# Patient Record
Sex: Female | Born: 1980 | Race: White | Hispanic: No | Marital: Married | Smoking: Never smoker
Health system: Southern US, Community
[De-identification: ages and names within clinical notes are randomized; demographics above are authoritative.]

## PROBLEM LIST (undated history)

## (undated) HISTORY — PX: WISDOM TOOTH EXTRACTION: SHX21

---

## 2001-09-15 HISTORY — PX: BREAST REDUCTION SURGERY: SHX8

## 2002-01-24 ENCOUNTER — Other Ambulatory Visit: Admission: RE | Admit: 2002-01-24 | Discharge: 2002-01-24 | Payer: Self-pay | Admitting: *Deleted

## 2003-01-31 ENCOUNTER — Other Ambulatory Visit: Admission: RE | Admit: 2003-01-31 | Discharge: 2003-01-31 | Payer: Self-pay | Admitting: *Deleted

## 2004-07-17 ENCOUNTER — Other Ambulatory Visit: Admission: RE | Admit: 2004-07-17 | Discharge: 2004-07-17 | Payer: Self-pay | Admitting: Obstetrics and Gynecology

## 2005-11-10 ENCOUNTER — Other Ambulatory Visit: Admission: RE | Admit: 2005-11-10 | Discharge: 2005-11-10 | Payer: Self-pay | Admitting: Obstetrics and Gynecology

## 2005-11-13 ENCOUNTER — Encounter: Payer: Self-pay | Admitting: Family Medicine

## 2005-11-21 ENCOUNTER — Encounter: Admission: RE | Admit: 2005-11-21 | Discharge: 2005-11-21 | Payer: Self-pay | Admitting: Obstetrics and Gynecology

## 2006-03-05 ENCOUNTER — Ambulatory Visit: Payer: Self-pay | Admitting: Family Medicine

## 2006-06-23 DIAGNOSIS — J309 Allergic rhinitis, unspecified: Secondary | ICD-10-CM | POA: Insufficient documentation

## 2007-01-12 ENCOUNTER — Encounter: Payer: Self-pay | Admitting: Family Medicine

## 2007-09-16 HISTORY — PX: OTHER SURGICAL HISTORY: SHX169

## 2008-08-01 ENCOUNTER — Ambulatory Visit: Payer: Self-pay | Admitting: Obstetrics & Gynecology

## 2008-08-01 ENCOUNTER — Encounter: Payer: Self-pay | Admitting: Obstetrics and Gynecology

## 2008-08-01 LAB — CONVERTED CEMR LAB
Antibody Screen: NEGATIVE
Basophils Relative: 0 % (ref 0–1)
Eosinophils Absolute: 0.1 10*3/uL (ref 0.0–0.7)
Eosinophils Relative: 1 % (ref 0–5)
Lymphs Abs: 2.1 10*3/uL (ref 0.7–4.0)
Neutrophils Relative %: 72 % (ref 43–77)
Platelets: 257 10*3/uL (ref 150–400)
RDW: 12.3 % (ref 11.5–15.5)
Rh Type: POSITIVE
Rubella: 56.9 intl units/mL — ABNORMAL HIGH
WBC: 10.1 10*3/uL (ref 4.0–10.5)

## 2008-08-02 ENCOUNTER — Encounter: Payer: Self-pay | Admitting: Obstetrics and Gynecology

## 2008-08-28 ENCOUNTER — Encounter: Payer: Self-pay | Admitting: Family

## 2008-08-28 ENCOUNTER — Ambulatory Visit: Payer: Self-pay | Admitting: Family

## 2008-09-25 ENCOUNTER — Ambulatory Visit: Payer: Self-pay | Admitting: Obstetrics and Gynecology

## 2008-10-20 ENCOUNTER — Ambulatory Visit (HOSPITAL_COMMUNITY): Admission: RE | Admit: 2008-10-20 | Discharge: 2008-10-20 | Payer: Self-pay | Admitting: Obstetrics & Gynecology

## 2008-10-23 ENCOUNTER — Ambulatory Visit: Payer: Self-pay | Admitting: Obstetrics and Gynecology

## 2008-11-20 ENCOUNTER — Ambulatory Visit: Payer: Self-pay | Admitting: Family

## 2008-12-25 ENCOUNTER — Ambulatory Visit: Payer: Self-pay | Admitting: Family

## 2008-12-26 ENCOUNTER — Encounter: Payer: Self-pay | Admitting: Obstetrics and Gynecology

## 2009-01-08 ENCOUNTER — Ambulatory Visit: Payer: Self-pay | Admitting: Obstetrics and Gynecology

## 2009-01-22 ENCOUNTER — Ambulatory Visit: Payer: Self-pay | Admitting: Family

## 2009-02-02 ENCOUNTER — Ambulatory Visit: Payer: Self-pay | Admitting: Family

## 2009-02-16 ENCOUNTER — Ambulatory Visit: Payer: Self-pay | Admitting: Family

## 2009-02-17 ENCOUNTER — Encounter: Payer: Self-pay | Admitting: Family

## 2009-02-17 LAB — CONVERTED CEMR LAB: GC Probe Amp, Genital: NEGATIVE

## 2009-02-23 ENCOUNTER — Ambulatory Visit: Payer: Self-pay | Admitting: Physician Assistant

## 2009-03-02 ENCOUNTER — Ambulatory Visit: Payer: Self-pay | Admitting: Family

## 2009-03-09 ENCOUNTER — Ambulatory Visit: Payer: Self-pay | Admitting: Physician Assistant

## 2009-03-15 ENCOUNTER — Inpatient Hospital Stay (HOSPITAL_COMMUNITY): Admission: AD | Admit: 2009-03-15 | Discharge: 2009-03-15 | Payer: Self-pay | Admitting: Obstetrics & Gynecology

## 2009-03-15 ENCOUNTER — Ambulatory Visit: Payer: Self-pay | Admitting: Obstetrics and Gynecology

## 2009-03-16 ENCOUNTER — Ambulatory Visit: Payer: Self-pay | Admitting: Obstetrics and Gynecology

## 2009-03-16 ENCOUNTER — Inpatient Hospital Stay (HOSPITAL_COMMUNITY): Admission: AD | Admit: 2009-03-16 | Discharge: 2009-03-18 | Payer: Self-pay | Admitting: Obstetrics & Gynecology

## 2009-03-23 ENCOUNTER — Ambulatory Visit: Admission: RE | Admit: 2009-03-23 | Discharge: 2009-03-23 | Payer: Self-pay | Admitting: Obstetrics & Gynecology

## 2009-04-27 ENCOUNTER — Ambulatory Visit: Payer: Self-pay | Admitting: Physician Assistant

## 2009-11-19 ENCOUNTER — Ambulatory Visit: Payer: Self-pay | Admitting: Family

## 2009-11-20 ENCOUNTER — Encounter: Payer: Self-pay | Admitting: Family

## 2009-11-20 LAB — CONVERTED CEMR LAB
Antibody Screen: NEGATIVE
Basophils Absolute: 0 10*3/uL (ref 0.0–0.1)
Basophils Relative: 0 % (ref 0–1)
Eosinophils Absolute: 0.1 10*3/uL (ref 0.0–0.7)
Eosinophils Relative: 1 % (ref 0–5)
HCT: 38 % (ref 36.0–46.0)
Hemoglobin: 12.5 g/dL (ref 12.0–15.0)
RBC: 3.97 M/uL (ref 3.87–5.11)
RDW: 13 % (ref 11.5–15.5)

## 2009-12-17 ENCOUNTER — Ambulatory Visit: Payer: Self-pay | Admitting: Obstetrics and Gynecology

## 2010-01-14 ENCOUNTER — Ambulatory Visit: Payer: Self-pay | Admitting: Advanced Practice Midwife

## 2010-02-19 ENCOUNTER — Ambulatory Visit: Payer: Self-pay | Admitting: Obstetrics & Gynecology

## 2010-02-19 ENCOUNTER — Ambulatory Visit (HOSPITAL_COMMUNITY): Admission: RE | Admit: 2010-02-19 | Discharge: 2010-02-19 | Payer: Self-pay | Admitting: Obstetrics & Gynecology

## 2010-03-19 ENCOUNTER — Ambulatory Visit: Payer: Self-pay | Admitting: Obstetrics & Gynecology

## 2010-03-20 ENCOUNTER — Encounter: Payer: Self-pay | Admitting: Physician Assistant

## 2010-03-20 LAB — CONVERTED CEMR LAB

## 2010-04-15 ENCOUNTER — Ambulatory Visit: Payer: Self-pay | Admitting: Advanced Practice Midwife

## 2010-04-29 ENCOUNTER — Ambulatory Visit: Payer: Self-pay | Admitting: Physician Assistant

## 2010-05-13 ENCOUNTER — Ambulatory Visit: Payer: Self-pay | Admitting: Obstetrics and Gynecology

## 2010-05-27 ENCOUNTER — Ambulatory Visit: Payer: Self-pay | Admitting: Advanced Practice Midwife

## 2010-06-03 ENCOUNTER — Ambulatory Visit: Payer: Self-pay | Admitting: Advanced Practice Midwife

## 2010-06-10 ENCOUNTER — Ambulatory Visit: Payer: Self-pay | Admitting: Obstetrics & Gynecology

## 2010-06-17 ENCOUNTER — Ambulatory Visit: Payer: Self-pay | Admitting: Physician Assistant

## 2010-06-24 ENCOUNTER — Ambulatory Visit: Payer: Self-pay | Admitting: Obstetrics & Gynecology

## 2010-07-01 ENCOUNTER — Ambulatory Visit: Payer: Self-pay | Admitting: Obstetrics and Gynecology

## 2010-07-05 ENCOUNTER — Ambulatory Visit: Payer: Self-pay | Admitting: Advanced Practice Midwife

## 2010-07-08 ENCOUNTER — Ambulatory Visit: Payer: Self-pay | Admitting: Family Medicine

## 2010-07-08 ENCOUNTER — Inpatient Hospital Stay (HOSPITAL_COMMUNITY): Admission: AD | Admit: 2010-07-08 | Discharge: 2010-07-09 | Payer: Self-pay | Admitting: Obstetrics and Gynecology

## 2010-08-19 ENCOUNTER — Ambulatory Visit: Payer: Self-pay | Admitting: Advanced Practice Midwife

## 2010-11-27 LAB — CBC
HCT: 33.3 % — ABNORMAL LOW (ref 36.0–46.0)
HCT: 35.7 % — ABNORMAL LOW (ref 36.0–46.0)
Hemoglobin: 11.3 g/dL — ABNORMAL LOW (ref 12.0–15.0)
MCH: 33.4 pg (ref 26.0–34.0)
MCHC: 33.5 g/dL (ref 30.0–36.0)
MCV: 98.7 fL (ref 78.0–100.0)
Platelets: 247 10*3/uL (ref 150–400)
RBC: 3.37 MIL/uL — ABNORMAL LOW (ref 3.87–5.11)
RBC: 3.63 MIL/uL — ABNORMAL LOW (ref 3.87–5.11)
RDW: 13.5 % (ref 11.5–15.5)
RDW: 13.9 % (ref 11.5–15.5)
WBC: 19.7 10*3/uL — ABNORMAL HIGH (ref 4.0–10.5)

## 2010-12-22 LAB — CBC: MCHC: 34.3 g/dL (ref 30.0–36.0)

## 2011-01-28 NOTE — Assessment & Plan Note (Signed)
NAME:  LINZIE, CRISS NO.:  [id number]   MEDICAL RECORD NO.:  [id number]          PATIENT TYPE:  POB   LOCATION:  [facility] at [place]         FACILITY:  [facility]   PHYSICIAN:  Maylon Cos, CNM    DATE OF BIRTH:  March 31, 1981   DATE OF SERVICE:  04/27/2009                                  CLINIC NOTE   The patient is being seen in the [facility] [employer] at  [place].  Today's visit is for her 6-week postpartum visit.   HISTORY OF PRESENT ILLNESS:  The patient had a spontaneous vaginal  delivery at [facility] in [place] on March 16, 2009.  She  delivered a viable female infant, weighing 7 pounds 12 ounces, slower by  Caren Griffins, under epidural anesthesia.  She had no complications.  She  did have a small periurethral laceration that was repaired, she had no  vaginal lacerations.  She presents today with no complaints.  She is  breast-feeding and supplementing using DNS supplementation system is  being followed by Lactation at [facility].  She is currently  continuing her prenatal vitamins and is using condoms for birth control.  She has resumed intercourse and has no complaints.  She states that she  is at postpartum bleeding for approximately 3-1/2 weeks postpartum and  has not resumed period yet.  She is pumping and nursing approximately 4-  5 times a day.  She reports sleeping 4-6 hours nightly.  She feels like  she is getting adequate nutrition and this has been no complaints or  denies symptoms of postpartum blues and/or depression.  She is getting  adequate support from her family and husband and plans to return to work  in 1 month.  Currently, she is on 17 pounds of the postpartum period and  is about 15 pounds away from her prepregnancy weight.  She is planning  to begin an exercise regimen as she just brought a new treadmill to  begin exercise and desires clearance to begin this regimen.   PHYSICAL EXAMINATION:  GENERAL:  Today, a  pleasant 30 year old Caucasian  female who is in no apparent distress.  VITAL SIGNS:  Stable.  Her pulse is 72, blood pressure is 122/77, weight  is 178, and height is 66 inches.  HEENT:  Exam is grossly normal.  BREASTS:  Soft and nontender and lactating.  ABDOMEN:  Benign.  No diastasis.  RECTAL:  Deferred secondary to no complaints.  EXTREMITIES:  Warm to touch with even hair distribution and no edema.   ASSESSMENT:  1. Six weeks postpartum, status post normal spontaneous vaginal      delivery of a female infant.  2. Breast-feeding and supplementing with DNS, being followed by      Lactation.  3. Contraceptive non-hormonal condoms and spermicide recommended.   PLAN:  1. The patient is to continue condoms and spermicide use for birth      control.  2. Continue daily prenatal vitamin for health maintenance and also      folic acid was given.  She is on non-hormonal birth control.  3. Clearance given to initiate diet and exercise as discussed.  4. Will return to  work as planned in 1 month.  5. Followup in December for annual exam or p.r.n. problems.           ______________________________  Maylon Cos, CNM     SS/MEDQ  D:  04/27/2009  T:  04/27/2009  Job:  [id number]

## 2011-01-28 NOTE — Assessment & Plan Note (Signed)
NAME:  Stacy Chavez, Stacy Chavez NO.:  [id number]   MEDICAL RECORD NO.:  [id number]          PATIENT TYPE:  POB   LOCATION:  [facility] at [place]         FACILITY:  [facility]   PHYSICIAN:  Wynelle Bourgeois, CNM    DATE OF BIRTH:  06-23-1981   DATE OF SERVICE:  08/19/2010                                  CLINIC NOTE   This is a 30 year old, gravida 2, para 2 who is approximately 6 weeks'  postpartum who presents for her postpartum checkup.  She is requesting  diaphragm fitting for contraception.  She states she has reviewed some  information on her own and has no questions about the diaphragm.  She  had a vaginal delivery on July 08, 2010, with Dr. Shawnie Pons of a viable  female infant weighing 7 pounds 14 ounces, Apgars 9 and 9.  She had just  a first-degree abrasion which was not repaired and otherwise did very  well.  Her delivery was remarkable for a rapid second stage.  She has  had normal progression of her lochia diminishment since delivery.  She  is not complaining of any pain.  She is breast-feeding without  difficulty and has no complaints.   OBJECTIVE DATA:  VITAL SIGNS:  Pulse 79, blood pressure 138/86, weight  173, height 66 inches.   ALLERGIES:  None.   Last Pap smear was in March.  She is currently taking prenatal vitamins.  Abdomen is soft and nontender.  Perineum is well-healed. Vagina is  clean, well rugated with no lacerations or erythema.  Cervix is closed.  Uterus is small and well involuted.  Adnexa are nontender.  Her  diaphragm fitting was performed and the size 8 seemed to fit the best.  She does have a shallow pubic arch and there is slight movement of the  diaphragm under the pubic arch, no matter what size is utilized.  The  patient is made aware of this. Prescription was given for All-Flex #8  diaphragm with one refill.  The patient was instructed to always use  spermicide with the diaphragm and is aware of failure rate and agrees to  proceed.  She  will continue prenatal vitamins while breast feeding.           ______________________________  Wynelle Bourgeois, CNM     MW/MEDQ  D:  08/19/2010  T:  08/20/2010  Job:  [id number]

## 2011-09-02 ENCOUNTER — Ambulatory Visit (INDEPENDENT_AMBULATORY_CARE_PROVIDER_SITE_OTHER): Payer: BC Managed Care – PPO | Admitting: Physician Assistant

## 2011-09-02 ENCOUNTER — Encounter: Payer: Self-pay | Admitting: Physician Assistant

## 2011-09-02 VITALS — BP 121/77 | HR 71 | Temp 98.5°F | Resp 16 | Ht 66.0 in | Wt 146.0 lb

## 2011-09-02 DIAGNOSIS — Z01419 Encounter for gynecological examination (general) (routine) without abnormal findings: Secondary | ICD-10-CM

## 2011-09-02 DIAGNOSIS — Z0000 Encounter for general adult medical examination without abnormal findings: Secondary | ICD-10-CM

## 2011-09-02 NOTE — Progress Notes (Signed)
30 yo G2P2002 with no complaints, NL periods. LMP 08/17/2011. No exam performed today secondary to no hx of abnormal pap, last pap due 11/23/09. Full physical and bloodwork perform in last 6 months with PCP. Natural family planning. Husband scheduled for vasectomy in February.   RTC 11/2012 for annual with pap or prn problems.  Cortlandt Capuano E. 09/02/2011 3:55 PM

## 2011-09-02 NOTE — Patient Instructions (Signed)
Exercise to Stay Healthy Exercise helps you become and stay healthy. EXERCISE IDEAS AND TIPS Choose exercises that:  You enjoy.   Fit into your day.  You do not need to exercise really hard to be healthy. You can do exercises at a slow or medium level and stay healthy. You can:  Stretch before and after working out.   Try yoga, Pilates, or tai chi.   Lift weights.   Walk fast, swim, jog, run, climb stairs, bicycle, dance, or rollerskate.   Take aerobic classes.  Exercises that burn about 150 calories:  Running 1  miles in 15 minutes.   Playing volleyball for 45 to 60 minutes.   Washing and waxing a car for 45 to 60 minutes.   Playing touch football for 45 minutes.   Walking 1  miles in 35 minutes.   Pushing a stroller 1  miles in 30 minutes.   Playing basketball for 30 minutes.   Raking leaves for 30 minutes.   Bicycling 5 miles in 30 minutes.   Walking 2 miles in 30 minutes.   Dancing for 30 minutes.   Shoveling snow for 15 minutes.   Swimming laps for 20 minutes.   Walking up stairs for 15 minutes.   Bicycling 4 miles in 15 minutes.   Gardening for 30 to 45 minutes.   Jumping rope for 15 minutes.   Washing windows or floors for 45 to 60 minutes.  Document Released: 10/04/2010 Document Revised: 05/14/2011 Document Reviewed: 10/04/2010 Exit[employer] Patient Information 2012 [employer]. 

## 2012-05-05 IMAGING — US US OB DETAIL+14 WK
1 series · 14 of 28 positions shown · non-contrast
Comparison: none

OBSTETRICAL ULTRASOUND:
 This ultrasound exam was performed in the [HOSPITAL] Ultrasound Department.  The OB US report was generated in the AS system, and faxed to the ordering physician.  This report is also available in [HOSPITAL]?s AccessANYware and in [REDACTED] PACS.

[Series 1: us ob detail +14 wk · 0.24mm/px · 14 of 59 slices shown]
[im 3/59]
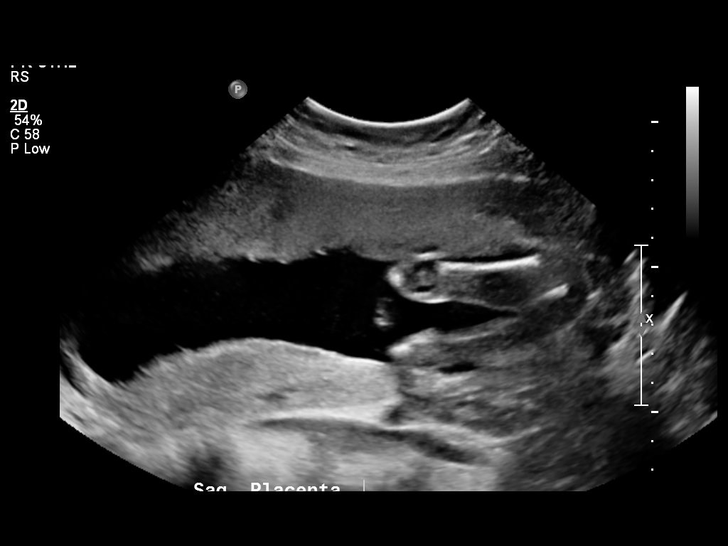
[im 7/59]
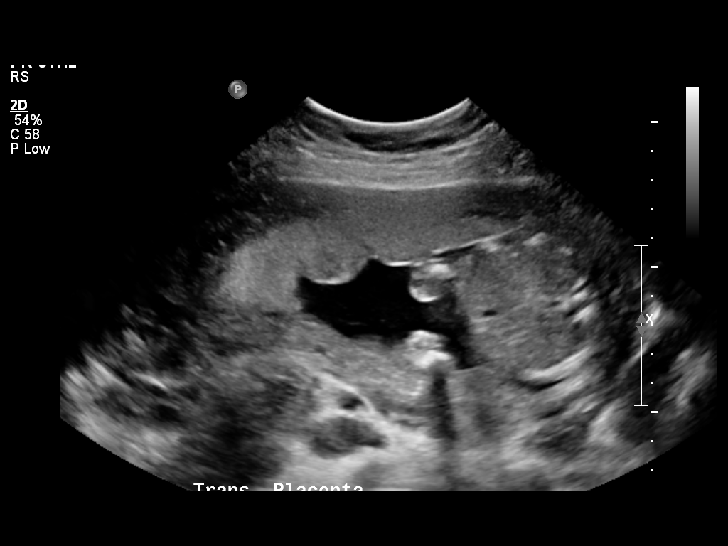
[im 11/59]
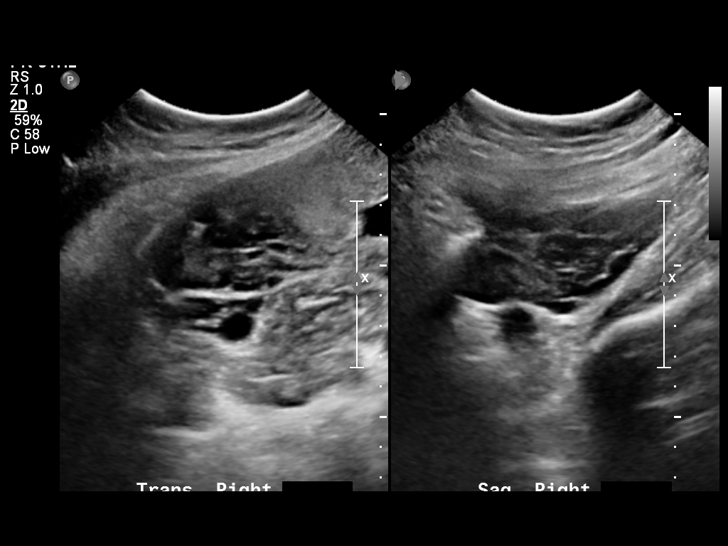
[im 16/59]
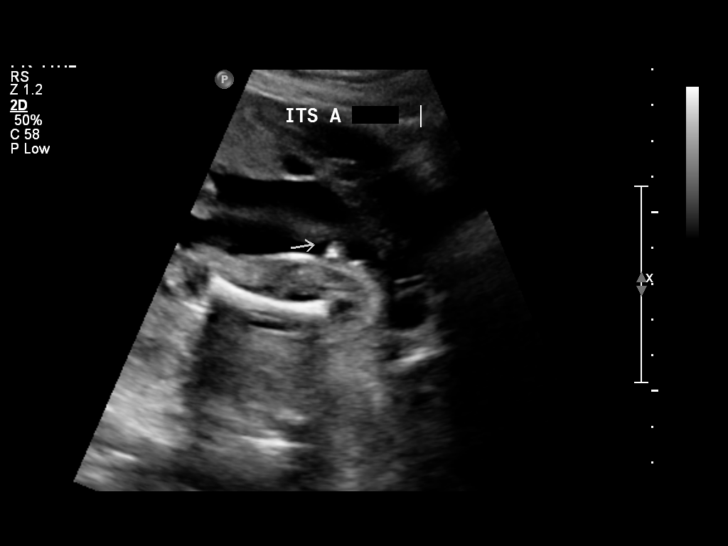
[im 20/59]
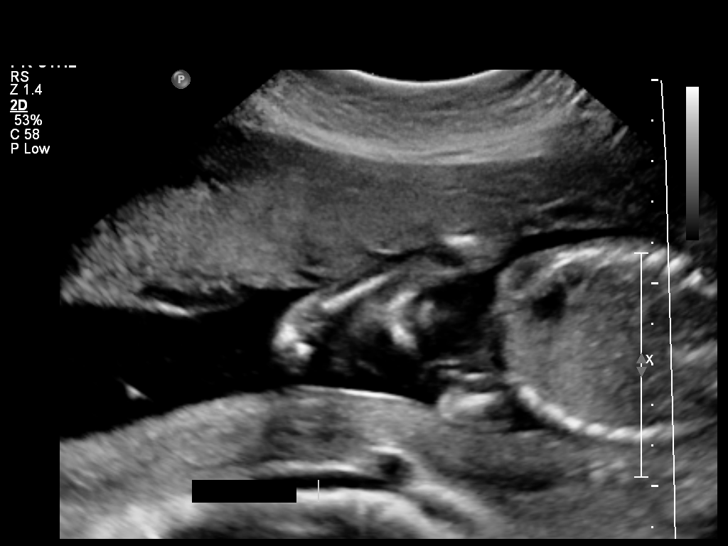
[im 24/59]
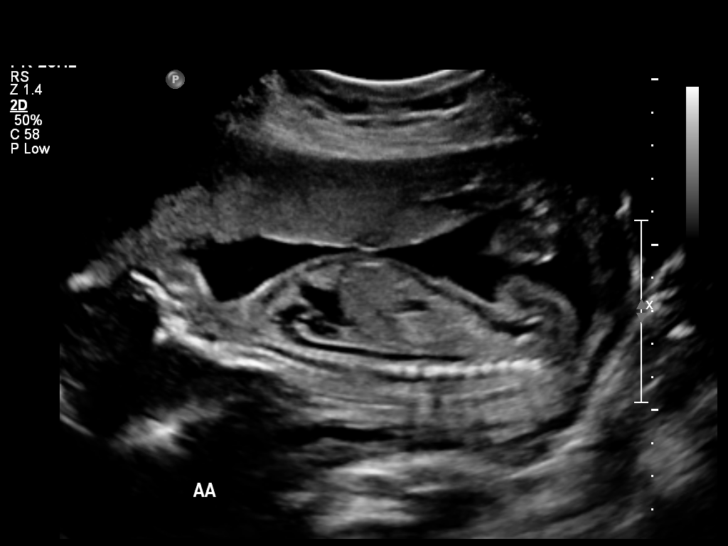
[im 28/59]
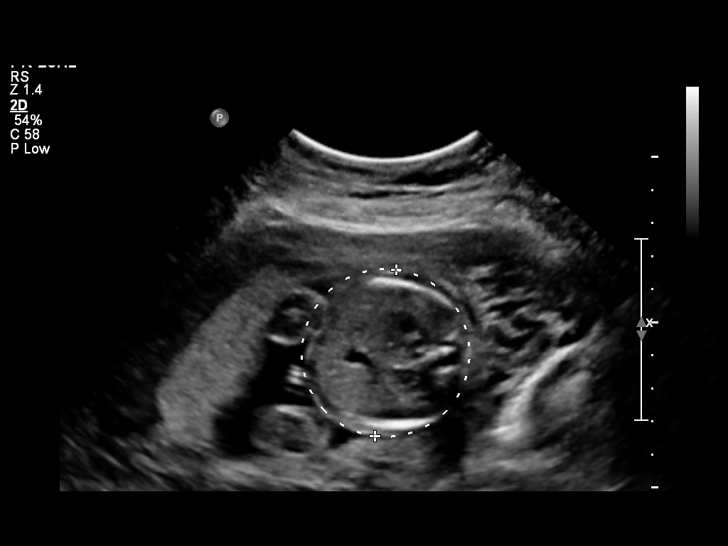
[im 33/59]
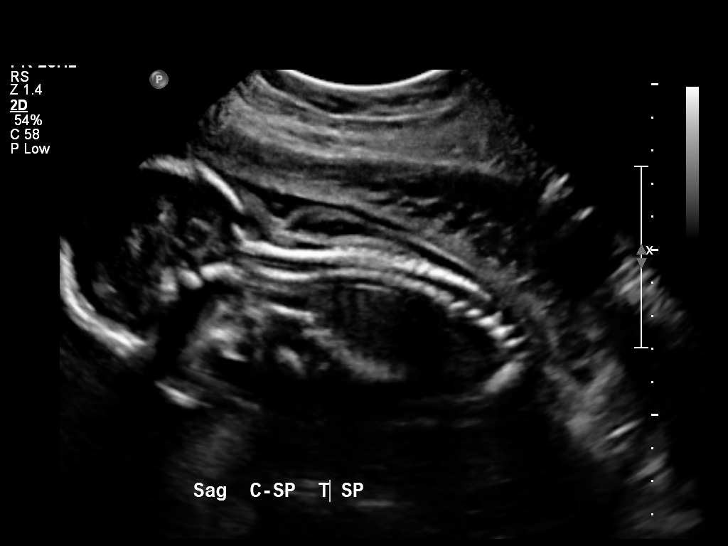
[im 37/59]
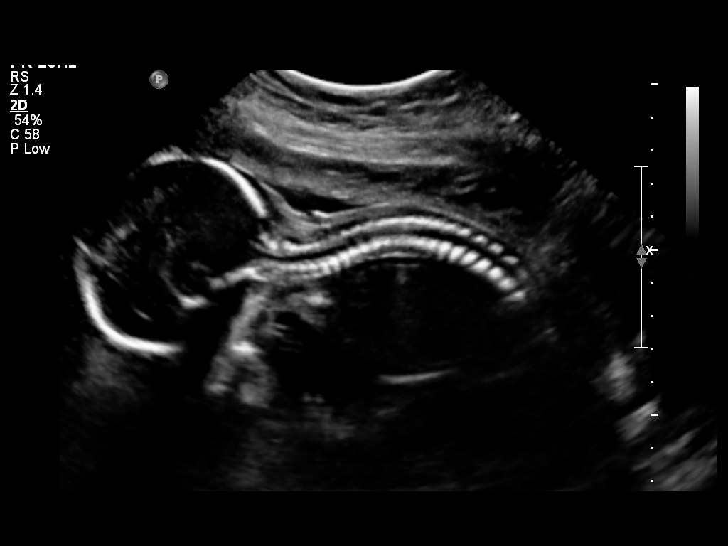
[im 41/59]
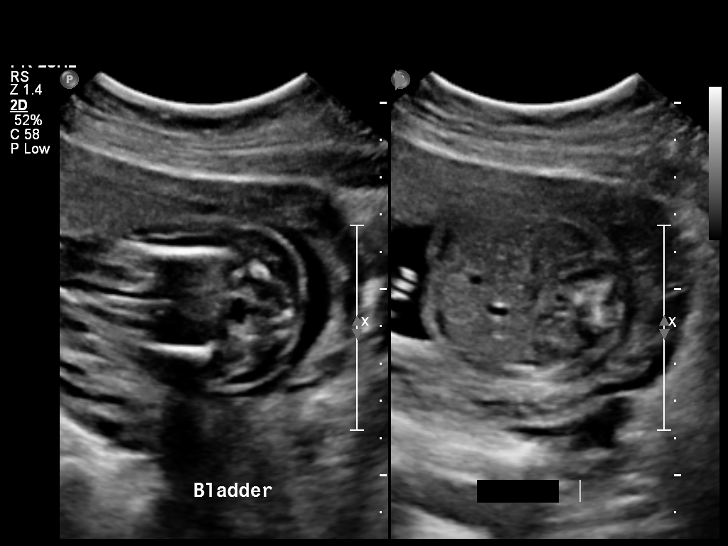
[im 46/59]
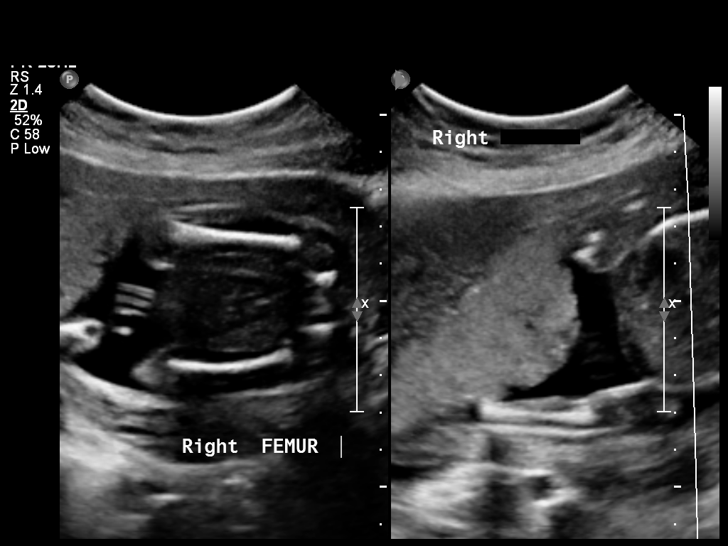
[im 50/59]
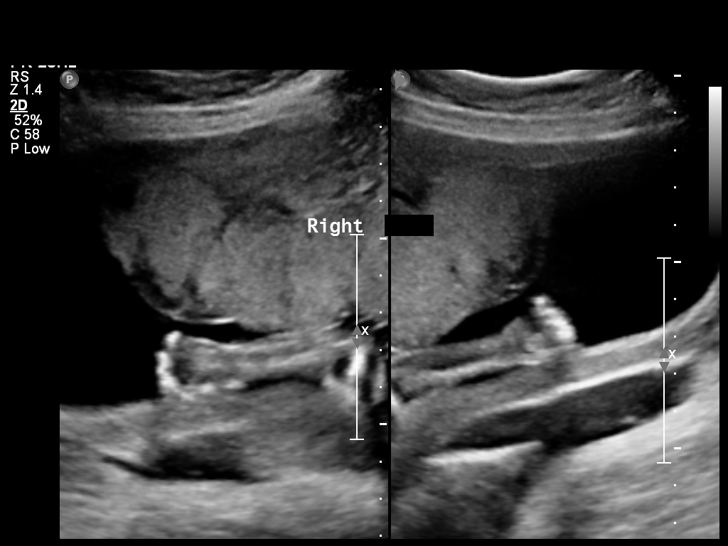
[im 54/59]
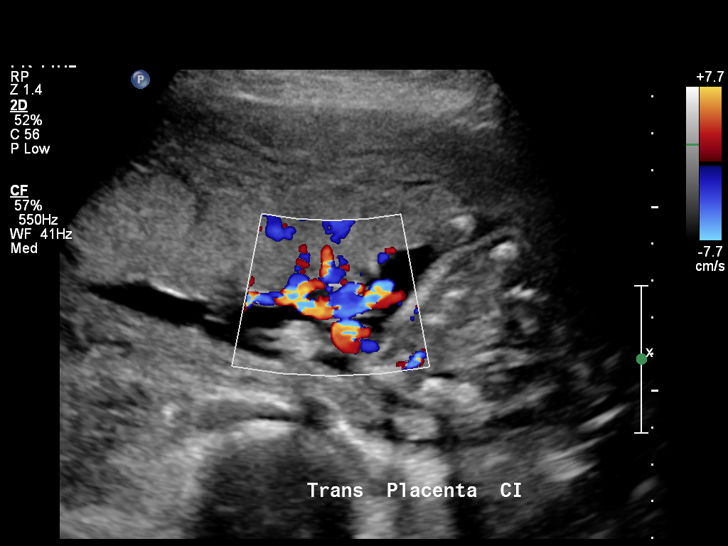
[im 59/59]
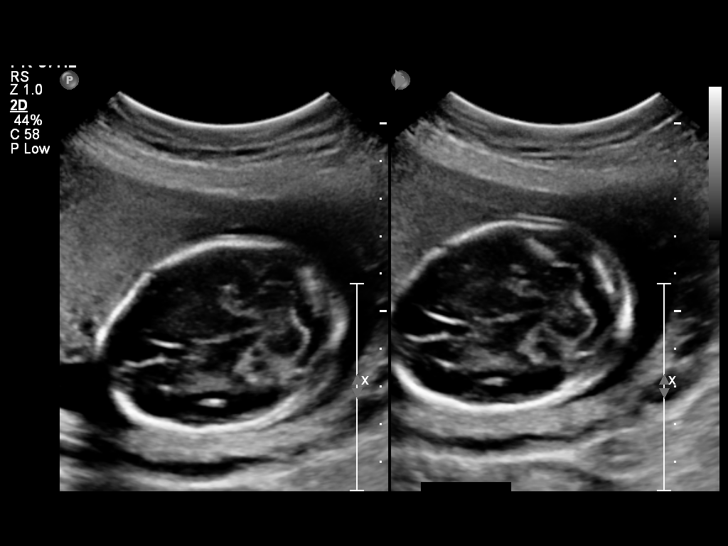

[14 of 28 positions shown; findings below may reference images not displayed]

IMPRESSION: See AS Obstetric US report.

## 2013-06-14 ENCOUNTER — Ambulatory Visit: Payer: BC Managed Care – PPO | Admitting: Obstetrics & Gynecology

## 2013-06-21 ENCOUNTER — Ambulatory Visit (INDEPENDENT_AMBULATORY_CARE_PROVIDER_SITE_OTHER): Payer: BC Managed Care – PPO | Admitting: Obstetrics & Gynecology

## 2013-06-21 ENCOUNTER — Encounter: Payer: Self-pay | Admitting: Obstetrics & Gynecology

## 2013-06-21 VITALS — BP 126/87 | HR 78 | Resp 16 | Ht 66.0 in | Wt 145.0 lb

## 2013-06-21 DIAGNOSIS — N6310 Unspecified lump in the right breast, unspecified quadrant: Secondary | ICD-10-CM

## 2013-06-21 DIAGNOSIS — Z124 Encounter for screening for malignant neoplasm of cervix: Secondary | ICD-10-CM

## 2013-06-21 DIAGNOSIS — N63 Unspecified lump in unspecified breast: Secondary | ICD-10-CM

## 2013-06-21 DIAGNOSIS — Z1151 Encounter for screening for human papillomavirus (HPV): Secondary | ICD-10-CM

## 2013-06-21 DIAGNOSIS — Z01419 Encounter for gynecological examination (general) (routine) without abnormal findings: Secondary | ICD-10-CM

## 2013-06-21 NOTE — Progress Notes (Signed)
  Subjective:     Stacy Chavez is a 32 y.o. female here for a routine exam.  Current complaints:recurrent  vulvar lump.  Personal health questionnaire reviewed: yes.   Gynecologic History Patient's last menstrual period was 05/24/2013. Contraception: vasectomy Last Pap: 2011. Results were: normal Last mammogram: 2007 (ultrasound). Results were: normal  Obstetric History OB History  Gravida Para Term Preterm AB SAB TAB Ectopic Multiple Living  2 2 2       2     # Outcome Date GA Lbr Len/2nd Weight Sex Delivery Anes PTL Lv  2 TRM      SVD     1 TRM      SVD          The following portions of the patient's history were reviewed and updated as appropriate: allergies, current medications, past family history, past medical history, past social history, past surgical history and problem list.  Review of Systems Pertinent items are noted in HPI.    Objective:      Filed Vitals:   06/21/13 1531  BP: 126/87  Pulse: 78  Resp: 16  Height: 5\' 6"  (1.676 m)  Weight: 145 lb (65.772 kg)   Vitals:  WNL General appearance: alert, cooperative and no distress Head: Normocephalic, without obvious abnormality, atraumatic Eyes: negative Throat: lips, mucosa, and tongue normal; teeth and gums normal Lungs: clear to auscultation bilaterally Breasts: normal appearance, Mass at 10 o'clock right breast, No nipple retraction or dimpling, No nipple discharge or bleeding Heart: regular rate and rhythm Abdomen: soft, non-tender; bowel sounds normal; no masses,  no organomegaly Pelvic: cervix normal in appearance, external genitalia normal, no adnexal masses or tenderness, no bladder tenderness, no cervical motion tenderness, perianal skin: no external genital warts noted, urethra without abnormality or discharge, uterus normal size, shape, and consistency and vagina normal without discharge Extremities: no edema, redness or tenderness in the calves or thighs Skin: no lesions or rash Lymph nodes:  Axillary adenopathy: none         Assessment:    Healthy female exam.    Plan:    Education reviewed: self breast exams and skin cancer screening. Contraception: vasectomy. Mammogram ordered. Follow up in: 1 year.

## 2013-06-21 NOTE — Addendum Note (Signed)
Addended by: Granville Lewis on: 06/21/2013 04:29 PM   Modules accepted: Orders

## 2013-07-06 ENCOUNTER — Encounter: Payer: Self-pay | Admitting: Obstetrics & Gynecology

## 2014-07-17 ENCOUNTER — Encounter: Payer: Self-pay | Admitting: Obstetrics & Gynecology

## 2015-09-18 ENCOUNTER — Ambulatory Visit (INDEPENDENT_AMBULATORY_CARE_PROVIDER_SITE_OTHER): Payer: 59 | Admitting: Physician Assistant

## 2015-09-18 ENCOUNTER — Encounter: Payer: Self-pay | Admitting: Physician Assistant

## 2015-09-18 VITALS — BP 116/79 | HR 72 | Resp 16 | Ht 66.0 in | Wt 160.0 lb

## 2015-09-18 DIAGNOSIS — Z01419 Encounter for gynecological examination (general) (routine) without abnormal findings: Secondary | ICD-10-CM | POA: Diagnosis not present

## 2015-09-18 DIAGNOSIS — Z1151 Encounter for screening for human papillomavirus (HPV): Secondary | ICD-10-CM | POA: Diagnosis not present

## 2015-09-18 DIAGNOSIS — Z124 Encounter for screening for malignant neoplasm of cervix: Secondary | ICD-10-CM

## 2015-09-18 NOTE — Patient Instructions (Signed)
Breast Self-Awareness Practicing breast self-awareness may pick up problems early, prevent significant medical complications, and possibly save your life. By practicing breast self-awareness, you can become familiar with how your breasts look and feel and if your breasts are changing. This allows you to notice changes early. It can also offer you some reassurance that your breast health is good. One way to learn what is normal for your breasts and whether your breasts are changing is to do a breast self-exam. If you find a lump or something that was not present in the past, it is best to contact your caregiver right away. Other findings that should be evaluated by your caregiver include nipple discharge, especially if it is bloody; skin changes or reddening; areas where the skin seems to be pulled in (retracted); or new lumps and bumps. Breast pain is seldom associated with cancer (malignancy), but should also be evaluated by a caregiver. HOW TO PERFORM A BREAST SELF-EXAM The best time to examine your breasts is 5-7 days after your menstrual period is over. During menstruation, the breasts are lumpier, and it may be more difficult to pick up changes. If you do not menstruate, have reached menopause, or had your uterus removed (hysterectomy), you should examine your breasts at regular intervals, such as monthly. If you are breastfeeding, examine your breasts after a feeding or after using a breast pump. Breast implants do not decrease the risk for lumps or tumors, so continue to perform breast self-exams as recommended. Talk to your caregiver about how to determine the difference between the implant and breast tissue. Also, talk about the amount of pressure you should use during the exam. Over time, you will become more familiar with the variations of your breasts and more comfortable with the exam. A breast self-exam requires you to remove all your clothes above the waist. 1. Look at your breasts and nipples.  Stand in front of a mirror in a room with good lighting. With your hands on your hips, push your hands firmly downward. Look for a difference in shape, contour, and size from one breast to the other (asymmetry). Asymmetry includes puckers, dips, or bumps. Also, look for skin changes, such as reddened or scaly areas on the breasts. Look for nipple changes, such as discharge, dimpling, repositioning, or redness. 2. Carefully feel your breasts. This is best done either in the shower or tub while using soapy water or when flat on your back. Place the arm (on the side of the breast you are examining) above your head. Use the pads (not the fingertips) of your three middle fingers on your opposite hand to feel your breasts. Start in the underarm area and use  inch (2 cm) overlapping circles to feel your breast. Use 3 different levels of pressure (light, medium, and firm pressure) at each circle before moving to the next circle. The light pressure is needed to feel the tissue closest to the skin. The medium pressure will help to feel breast tissue a little deeper, while the firm pressure is needed to feel the tissue close to the ribs. Continue the overlapping circles, moving downward over the breast until you feel your ribs below your breast. Then, move one finger-width towards the center of the body. Continue to use the  inch (2 cm) overlapping circles to feel your breast as you move slowly up toward the collar bone (clavicle) near the base of the neck. Continue the up and down exam using all 3 pressures until you reach the   middle of the chest. Do this with each breast, carefully feeling for lumps or changes. 3.  Keep a written record with breast changes or normal findings for each breast. By writing this information down, you do not need to depend only on memory for size, tenderness, or location. Write down where you are in your menstrual cycle, if you are still menstruating. Breast tissue can have some lumps or  thick tissue. However, see your caregiver if you find anything that concerns you.  SEEK MEDICAL CARE IF:  You see a change in shape, contour, or size of your breasts or nipples.   You see skin changes, such as reddened or scaly areas on the breasts or nipples.   You have an unusual discharge from your nipples.   You feel a new lump or unusually thick areas.    This information is not intended to replace advice given to you by your health care provider. Make sure you discuss any questions you have with your health care provider.   Document Released: 09/01/2005 Document Revised: 08/18/2012 Document Reviewed: 12/17/2011 Elsevier Interactive Patient Education 2016 Elsevier Inc.  

## 2015-09-18 NOTE — Progress Notes (Signed)
Patient ID: Stacy Chavez, female   DOB: Nov 28, 1980, [age] y.o.   MRN: [id number] History:  Stacy Chavez is a 35 y.o. [id number] who presents to clinic today for annual exam with pap smear.  She states she has no problems.  She performs SBE regularly.  No issues with period.     The following portions of the patient's history were reviewed and updated as appropriate: allergies, current medications, past family history, past medical history, past social history, past surgical history and problem list.  Review of Systems:  Pertinent items noted in HPI and remainder of comprehensive ROS otherwise negative.  Objective:  Physical Exam BP 116/79 mmHg  Pulse 72  Resp 16  Ht 5\' 6"  (1.676 m)  Wt 160 lb (72.576 kg)  BMI 25.84 kg/m2  LMP 09/25/2014 GENERAL: Well-developed, well-nourished female in no acute distress.  HEENT: Normocephalic, atraumatic.  NECK: Supple. Normal thyroid.  RESPIRATORY: Normal rate. Clear to auscultation bilaterally.  CARDIOVASCULAR: Regular rate and rhythm with no adventitious sounds.  BREASTS: Symmetric in size. No masses, skin changes, nipple drainage, or lymphadenopathy. ABDOMEN: Soft, nontender, nondistended. No organomegaly. Normal bowel sounds appreciated in all quadrants.  PELVIC: Normal external female genitalia. Vagina is pink and rugated.  Normal discharge. Normal cervix contour. Pap smear obtained. Uterus is normal in size. No adnexal mass or tenderness.  EXTREMITIES: No cyanosis, clubbing, or edema, 2+ distal pulses.   Labs and Imaging No results found.  Assessment & Plan:  Assessment: Healthy 35 year old female.  Partner has vasectomy for contraception  Plans: Monthly SBE Balanced diet and regular exercise encouraged Return PRN or for annual exam  Bertram DenverKaren E Teague Clark, PA-C 09/18/2015 9:30 AM

## 2015-09-24 LAB — CYTOLOGY - PAP

## 2018-07-14 ENCOUNTER — Encounter: Payer: Self-pay | Admitting: Family Medicine

## 2018-07-14 ENCOUNTER — Ambulatory Visit (INDEPENDENT_AMBULATORY_CARE_PROVIDER_SITE_OTHER): Payer: 59 | Admitting: Family Medicine

## 2018-07-14 VITALS — BP 118/81 | HR 82 | Ht 65.0 in | Wt 166.0 lb

## 2018-07-14 DIAGNOSIS — B373 Candidiasis of vulva and vagina: Secondary | ICD-10-CM

## 2018-07-14 DIAGNOSIS — N898 Other specified noninflammatory disorders of vagina: Secondary | ICD-10-CM | POA: Diagnosis not present

## 2018-07-14 DIAGNOSIS — N9089 Other specified noninflammatory disorders of vulva and perineum: Secondary | ICD-10-CM | POA: Diagnosis not present

## 2018-07-14 DIAGNOSIS — Z113 Encounter for screening for infections with a predominantly sexual mode of transmission: Secondary | ICD-10-CM | POA: Diagnosis not present

## 2018-07-14 MED ORDER — FLUCONAZOLE 150 MG PO TABS: 150.0000 mg | ORAL_TABLET | Freq: Every day | ORAL | 2 refills | Status: DC

## 2018-07-14 NOTE — Progress Notes (Signed)
    Subjective:    Patient ID: Stacy Chavez is a 37 y.o. female presenting with Vaginitis  on 07/14/2018  HPI: Has had yeast infection. First at end of summer, in bathing suit. Used monistat and it went away but took a while. Then got a second one and it took 2-3 wks to go away. White discharge noted and soreness. Minimal odor. Some irritation, but little to no itching. Last cycle was approx. 1 month ago. No tampon use. No change in detergent, soap, types of underwear. No new routines. Recent CBGs were WNL. Has had 3 in 2 months. There are times without any symptoms, but they recur.   Review of Systems  Constitutional: Negative for chills and fever.  Respiratory: Negative for shortness of breath.   Cardiovascular: Negative for chest pain.  Gastrointestinal: Negative for abdominal pain, nausea and vomiting.  Genitourinary: Negative for dysuria.  Skin: Negative for rash.      Objective:    BP 118/81   Pulse 82   Ht 5\' 5"  (1.651 m)   Wt 166 lb (75.3 kg)   LMP 06/14/2018   BMI 27.62 kg/m  Physical Exam  Constitutional: She is oriented to person, place, and time. She appears well-developed and well-nourished. No distress.  HENT:  Head: Normocephalic and atraumatic.  Eyes: No scleral icterus.  Neck: Neck supple.  Cardiovascular: Normal rate.  Pulmonary/Chest: Effort normal.  Abdominal: Soft.  Genitourinary:  Genitourinary Comments: BUS normal, peri-labially there is some mild erythema, with some flaking, white areas, vagina is pink and rugated without discharge, cervix is parous without lesion   Neurological: She is alert and oriented to person, place, and time.  Skin: Skin is warm and dry.  Psychiatric: She has a normal mood and affect.        Assessment & Plan:   Problem List Items Addressed This Visit      Unprioritized   Vulvar irritation - Primary    Will check wet prep and treat presumptively with something stronger than OTC topical anti-fungals.  Given  appearance is most consistent with yeast. Avoid, any moisture. Cotton panties only. And this may be a dermatological problem such as lichen planus or the like and just isn't obvious at this time. Consider trial of topical steroid vs. Biopsy if not improving.       Relevant Medications   fluconazole (DIFLUCAN) 150 MG tablet   Other Relevant Orders   Cervicovaginal ancillary only       Total face-to-face time with patient: 15 minutes. Over 50% of encounter was spent on counseling and coordination of care. Return in about 4 weeks (around 08/11/2018).  Reva Bores 07/14/2018 1:59 PM

## 2018-07-14 NOTE — Patient Instructions (Signed)

## 2018-07-14 NOTE — Progress Notes (Signed)
PT c/o recurring yeast infection

## 2018-07-14 NOTE — Assessment & Plan Note (Signed)
Will check wet prep and treat presumptively with something stronger than OTC topical anti-fungals.  Given appearance is most consistent with yeast. Avoid, any moisture. Cotton panties only. And this may be a dermatological problem such as lichen planus or the like and just isn't obvious at this time. Consider trial of topical steroid vs. Biopsy if not improving.

## 2018-07-15 LAB — CERVICOVAGINAL ANCILLARY ONLY
BACTERIAL VAGINITIS: NEGATIVE
CANDIDA VAGINITIS: POSITIVE — AB
CHLAMYDIA, DNA PROBE: NEGATIVE
NEISSERIA GONORRHEA: NEGATIVE
Trichomonas: NEGATIVE

## 2018-08-11 ENCOUNTER — Ambulatory Visit (INDEPENDENT_AMBULATORY_CARE_PROVIDER_SITE_OTHER): Payer: 59 | Admitting: Obstetrics & Gynecology

## 2018-08-11 ENCOUNTER — Encounter: Payer: Self-pay | Admitting: Obstetrics & Gynecology

## 2018-08-11 VITALS — BP 109/68 | HR 79 | Resp 16 | Ht 65.0 in | Wt 163.0 lb

## 2018-08-11 DIAGNOSIS — Z124 Encounter for screening for malignant neoplasm of cervix: Secondary | ICD-10-CM

## 2018-08-11 DIAGNOSIS — Z1151 Encounter for screening for human papillomavirus (HPV): Secondary | ICD-10-CM

## 2018-08-11 DIAGNOSIS — Z01419 Encounter for gynecological examination (general) (routine) without abnormal findings: Secondary | ICD-10-CM | POA: Diagnosis not present

## 2018-08-11 NOTE — Patient Instructions (Signed)
Preventive Care 18-39 Years, Female Preventive care refers to lifestyle choices and visits with your health care provider that can promote health and wellness. What does preventive care include?  A yearly physical exam. This is also called an annual well check.  Dental exams once or twice a year.  Routine eye exams. Ask your health care provider how often you should have your eyes checked.  Personal lifestyle choices, including: ? Daily care of your teeth and gums. ? Regular physical activity. ? Eating a healthy diet. ? Avoiding tobacco and drug use. ? Limiting alcohol use. ? Practicing safe sex. ? Taking vitamin and mineral supplements as recommended by your health care provider. What happens during an annual well check? The services and screenings done by your health care provider during your annual well check will depend on your age, overall health, lifestyle risk factors, and family history of disease. Counseling Your health care provider may ask you questions about your:  Alcohol use.  Tobacco use.  Drug use.  Emotional well-being.  Home and relationship well-being.  Sexual activity.  Eating habits.  Work and work Statistician.  Method of birth control.  Menstrual cycle.  Pregnancy history.  Screening You may have the following tests or measurements:  Height, weight, and BMI.  Diabetes screening. This is done by checking your blood sugar (glucose) after you have not eaten for a while (fasting).  Blood pressure.  Lipid and cholesterol levels. These may be checked every 5 years starting at age 66.  Skin check.  Hepatitis C blood test.  Hepatitis B blood test.  Sexually transmitted disease (STD) testing.  BRCA-related cancer screening. This may be done if you have a family history of breast, ovarian, tubal, or peritoneal cancers.  Pelvic exam and Pap test. This may be done every 3 years starting at age 40. Starting at age 59, this may be done every 5  years if you have a Pap test in combination with an HPV test.  Discuss your test results, treatment options, and if necessary, the need for more tests with your health care provider. Vaccines Your health care provider may recommend certain vaccines, such as:  Influenza vaccine. This is recommended every year.  Tetanus, diphtheria, and acellular pertussis (Tdap, Td) vaccine. You may need a Td booster every 10 years.  Varicella vaccine. You may need this if you have not been vaccinated.  HPV vaccine. If you are 69 or younger, you may need three doses over 6 months.  Measles, mumps, and rubella (MMR) vaccine. You may need at least one dose of MMR. You may also need a second dose.  Pneumococcal 13-valent conjugate (PCV13) vaccine. You may need this if you have certain conditions and were not previously vaccinated.  Pneumococcal polysaccharide (PPSV23) vaccine. You may need one or two doses if you smoke cigarettes or if you have certain conditions.  Meningococcal vaccine. One dose is recommended if you are age 27-21 years and a first-year college student living in a residence hall, or if you have one of several medical conditions. You may also need additional booster doses.  Hepatitis A vaccine. You may need this if you have certain conditions or if you travel or work in places where you may be exposed to hepatitis A.  Hepatitis B vaccine. You may need this if you have certain conditions or if you travel or work in places where you may be exposed to hepatitis B.  Haemophilus influenzae type b (Hib) vaccine. You may need this if  you have certain risk factors.  Talk to your health care provider about which screenings and vaccines you need and how often you need them. This information is not intended to replace advice given to you by your health care provider. Make sure you discuss any questions you have with your health care provider. Document Released: 10/28/2001 Document Revised: 05/21/2016  Document Reviewed: 07/03/2015 Elsevier Interactive Patient Education  [employer].

## 2018-08-11 NOTE — Progress Notes (Signed)
GYNECOLOGY ANNUAL PREVENTATIVE CARE ENCOUNTER NOTE  Subjective:   Stacy Chavez is a 37 y.o. [phone] female here for a routine annual gynecologic exam.  Current complaints: none.   Denies abnormal vaginal bleeding, discharge, pelvic pain, problems with intercourse or other gynecologic concerns.    Gynecologic History Patient's last menstrual period was 07/16/2018. Contraception: vasectomy Last Pap: 09/18/2015. Results were: normal with negative HPV  Obstetric History OB History  Gravida Para Term Preterm AB Living  2 2 2     2   SAB TAB Ectopic Multiple Live Births               # Outcome Date GA Lbr Len/2nd Weight Sex Delivery Anes PTL Lv  2 Term      Vag-Spont     1 Term      Vag-Spont       History reviewed. No pertinent past medical history.  Past Surgical History:  Procedure Laterality Date  . BREAST REDUCTION SURGERY  2003  . gum graft  2009  . WISDOM TOOTH EXTRACTION      No current outpatient medications on file prior to visit.   No current facility-administered medications on file prior to visit.     No Known Allergies  Social History:  reports that she has never smoked. She has never used smokeless tobacco. She reports that she drinks alcohol. She reports that she does not use drugs.  Family History  Problem Relation Age of Onset  . Heart attack Maternal Grandfather   . Cancer Maternal Grandmother        skin  . Cancer Mother        skin  . Cancer Paternal Grandfather        lung  . Heart failure Paternal Grandmother   . Hyperlipidemia Father   . Hypertension Father   . Diabetes Father     The following portions of the patient's history were reviewed and updated as appropriate: allergies, current medications, past family history, past medical history, past social history, past surgical history and problem list.  Review of Systems Pertinent items noted in HPI and remainder of comprehensive ROS otherwise negative.   Objective:  BP 109/68    Pulse 79   Resp 16   Ht 5\' 5"  (1.651 m)   Wt 163 lb (73.9 kg)   LMP 07/16/2018   BMI 27.12 kg/m  CONSTITUTIONAL: Well-developed, well-nourished female in no acute distress.  HENT:  Normocephalic, atraumatic, External right and left ear normal. Oropharynx is clear and moist EYES: Conjunctivae and EOM are normal. Pupils are equal, round, and reactive to light. No scleral icterus.  NECK: Normal range of motion, supple, no masses.  Normal thyroid.  SKIN: Skin is warm and dry. No rash noted. Not diaphoretic. No erythema. No pallor. MUSCULOSKELETAL: Normal range of motion. No tenderness.  No cyanosis, clubbing, or edema.  2+ distal pulses. NEUROLOGIC: Alert and oriented to person, place, and time. Normal reflexes, muscle tone coordination. No cranial nerve deficit noted. PSYCHIATRIC: Normal mood and affect. Normal behavior. Normal judgment and thought content. CARDIOVASCULAR: Normal heart rate noted, regular rhythm RESPIRATORY: Clear to auscultation bilaterally. Effort and breath sounds normal, no problems with respiration noted. BREASTS: Symmetric in size. No masses, skin changes, nipple drainage, or lymphadenopathy. ABDOMEN: Soft, normal bowel sounds, no distention noted.  No tenderness, rebound or guarding.  PELVIC: Normal appearing external genitalia; normal appearing vaginal mucosa and cervix.  No abnormal discharge noted.  Pap smear obtained.  Normal uterine  size, no other palpable masses, no uterine or adnexal tenderness.   Assessment and Plan:  1. Well woman exam with routine gynecological exam - Cytology - PAP( [facility]) Will follow up results of pap smear and manage accordingly. Routine preventative health maintenance measures emphasized. Please refer to After Visit Summary for other counseling recommendations.    Jaynie CollinsUGONNA  Maruice Pieroni, MD, FACOG Obstetrician & Gynecologist, [facility], [facility]

## 2018-08-16 LAB — CYTOLOGY - PAP
Diagnosis: NEGATIVE
HPV (WINDOPATH): NOT DETECTED
